# Patient Record
Sex: Male | Born: 1990 | Race: White | Hispanic: Yes | Marital: Single
Health system: Southern US, Community
[De-identification: ages and names within clinical notes are randomized; demographics above are authoritative.]

---

## 2019-08-08 ENCOUNTER — Encounter (HOSPITAL_COMMUNITY): Payer: Self-pay | Admitting: Emergency Medicine

## 2019-08-08 ENCOUNTER — Emergency Department (HOSPITAL_COMMUNITY): Payer: Self-pay

## 2019-08-08 ENCOUNTER — Emergency Department (HOSPITAL_COMMUNITY)
Admission: EM | Admit: 2019-08-08 | Discharge: 2019-08-08 | Disposition: A | Discharge status: Home / Self Care | Payer: Self-pay | Attending: Emergency Medicine | Admitting: Emergency Medicine

## 2019-08-08 ENCOUNTER — Other Ambulatory Visit: Payer: Self-pay

## 2019-08-08 DIAGNOSIS — R079 Chest pain, unspecified: Secondary | ICD-10-CM | POA: Insufficient documentation

## 2019-08-08 DIAGNOSIS — R51 Headache: Secondary | ICD-10-CM | POA: Insufficient documentation

## 2019-08-08 DIAGNOSIS — G4482 Headache associated with sexual activity: Secondary | ICD-10-CM

## 2019-08-08 LAB — RAPID URINE DRUG SCREEN, HOSP PERFORMED
Amphetamines: NOT DETECTED
Barbiturates: NOT DETECTED
Benzodiazepines: NOT DETECTED
Cocaine: NOT DETECTED
Opiates: NOT DETECTED
Tetrahydrocannabinol: NOT DETECTED

## 2019-08-08 LAB — BASIC METABOLIC PANEL
Anion gap: 12 (ref 5–15)
BUN: 14 mg/dL (ref 6–20)
CO2: 25 mmol/L (ref 22–32)
Calcium: 10.1 mg/dL (ref 8.9–10.3)
Chloride: 105 mmol/L (ref 98–111)
Creatinine, Ser: 1.46 mg/dL — ABNORMAL HIGH (ref 0.61–1.24)
GFR calc Af Amer: >60 mL/min (ref >60)
GFR calc non Af Amer: >60 mL/min (ref >60)
Glucose, Bld: 70 mg/dL (ref 70–99)
Potassium: 4 mmol/L (ref 3.5–5.1)
Sodium: 142 mmol/L (ref 135–145)

## 2019-08-08 LAB — CBC
HCT: 47.2 % (ref 39.0–52.0)
Hemoglobin: 15.7 g/dL (ref 13.0–17.0)
MCH: 29.3 pg (ref 26.0–34.0)
MCHC: 33.3 g/dL (ref 30.0–36.0)
MCV: 88.2 fL (ref 80.0–100.0)
Platelets: 225 10*3/uL (ref 150–400)
RBC: 5.35 MIL/uL (ref 4.22–5.81)
RDW: 12.4 % (ref 11.5–15.5)
WBC: 8.9 10*3/uL (ref 4.0–10.5)
nRBC: 0 % (ref 0.0–0.2)

## 2019-08-08 LAB — CSF CELL COUNT WITH DIFFERENTIAL
RBC Count, CSF: 1260 /mm3 — ABNORMAL HIGH
RBC Count, CSF: 48 /mm3 — ABNORMAL HIGH
Tube #: 1
Tube #: 4
WBC, CSF: 0 /mm3 (ref 0–5)
WBC, CSF: 0 /mm3 (ref 0–5)

## 2019-08-08 LAB — TROPONIN I (HIGH SENSITIVITY)
Troponin I (High Sensitivity): 3 ng/L (ref <18)
Troponin I (High Sensitivity): <2 ng/L (ref <18)

## 2019-08-08 LAB — PROTEIN, CSF: Total  Protein, CSF: 37 mg/dL (ref 15–45)

## 2019-08-08 LAB — GLUCOSE, CSF: Glucose, CSF: 51 mg/dL (ref 40–70)

## 2019-08-08 MED ORDER — IOHEXOL 350 MG/ML SOLN
50.0000 mL | Freq: Once | INTRAVENOUS | Status: AC | PRN
  Administered 2019-08-08: 18:00:00 50 mL via INTRAVENOUS

## 2019-08-08 MED ORDER — PROPRANOLOL HCL 40 MG PO TABS: 40.0000 mg | ORAL_TABLET | Freq: Every day | ORAL | 0 refills | Status: AC

## 2019-08-08 MED ORDER — SODIUM CHLORIDE 0.9% FLUSH: 3.0000 mL | Freq: Once | INTRAVENOUS | Status: DC

## 2019-08-08 NOTE — ED Notes (Signed)
Patient verbalizes understanding of discharge instructions. Opportunity for questioning and answers were provided. Armband removed by staff, pt discharged from ED ambulatory.   

## 2019-08-08 NOTE — ED Provider Notes (Signed)
[facility] Provider Note   CSN: [id number] Arrival date & time: 08/08/19  1345   History   Chief Complaint Chief Complaint  Patient presents with   Headache    HPI Michael Walter is a 28 y.o. male who presents with a headache. No significant PMH. He states that 4 days ago he was having sex with his girlfriend and when he was about to ejaculate he had a sudden severe headache over the lateral neck and bilateral temples. He had never had a headache like this before. It lasted 20 minutes and then went away. He was having sex again 2 days ago and this time he pre-medicated with Tylenol and Advil before but the headache came back and this time it was worse. He has had a residual mild headache since then. It's over the bilateral temples and forehead that is now mild in nature. Rest makes it better. He denies LOC, trauma, neck stiffness. He has never had this before so he decided to come to the ED to be checked. He denies drug use. He is from [place] but his mother lives here and he dropped his children off with her and then he decided to come here.  Additionally he reports some dull chest pain that started 2 days ago after eating seafood. He has had similar symptoms before in the setting of eating [place] food and it's gone away. He denies associated fever, chills, SOB, cough, abdominal pain, N/V, leg swelling.     HPI  History reviewed. No pertinent past medical history.  There are no active problems to display for this patient.   History reviewed. No pertinent surgical history.      Home Medications    Prior to Admission medications   Not on File    Family History No family history on file.  Social History Social History   Tobacco Use   Smoking status: Not on file  Substance Use Topics   Alcohol use: Not on file   Drug use: Not on file     Allergies   Patient has no known allergies.   Review of Systems Review of  Systems  Constitutional: Negative for fever.  Eyes: Negative for visual disturbance.  Respiratory: Negative for cough and shortness of breath.   Cardiovascular: Positive for chest pain. Negative for palpitations and leg swelling.  Gastrointestinal: Negative for abdominal pain, nausea and vomiting.  Musculoskeletal: Positive for neck pain.  Neurological: Positive for headaches. Negative for dizziness, syncope, weakness and numbness.  All other systems reviewed and are negative.    Physical Exam Updated Vital Signs BP 137/81 (BP Location: Right Arm)    Pulse 70    Temp 98.5 F (36.9 C) (Oral)    Resp 20    SpO2 100%   Physical Exam Vitals signs and nursing note reviewed.  Constitutional:      General: He is not in acute distress.    Appearance: Normal appearance. He is well-developed. He is not ill-appearing.  HENT:     Head: Normocephalic and atraumatic.  Eyes:     General: No scleral icterus.       Right eye: No discharge.        Left eye: No discharge.     Conjunctiva/sclera: Conjunctivae normal.     Pupils: Pupils are equal, round, and reactive to light.  Neck:     Musculoskeletal: Normal range of motion.  Cardiovascular:     Rate and Rhythm: Normal rate and regular rhythm.  Pulmonary:     Effort: Pulmonary effort is normal. No respiratory distress.     Breath sounds: Normal breath sounds.  Abdominal:     General: There is no distension.     Palpations: Abdomen is soft.     Tenderness: There is no abdominal tenderness.  Skin:    General: Skin is warm and dry.  Neurological:     Mental Status: He is alert and oriented to person, place, and time.     Comments: Lying on stretcher in NAD. GCS 15. Speaks in a clear voice. Cranial nerves II through XII grossly intact. 5/5 strength in all extremities. Sensation fully intact.  Bilateral finger-to-nose intact. Ambulatory    Psychiatric:        Behavior: Behavior normal.      ED Treatments / Results  Labs (all labs  ordered are listed, but only abnormal results are displayed) Labs Reviewed  BASIC METABOLIC PANEL - Abnormal; Notable for the following components:      Result Value   Creatinine, Ser 1.46 (*)    All other components within normal limits  CSF CELL COUNT WITH DIFFERENTIAL - Abnormal; Notable for the following components:   Color, CSF PINK (*)    Appearance, CSF HAZY (*)    RBC Count, CSF 1,260 (*)    All other components within normal limits  CSF CELL COUNT WITH DIFFERENTIAL - Abnormal; Notable for the following components:   RBC Count, CSF 48 (*)    All other components within normal limits  CSF CULTURE  CBC  RAPID URINE DRUG SCREEN, HOSP PERFORMED  GLUCOSE, CSF  PROTEIN, CSF  TROPONIN I (HIGH SENSITIVITY)  TROPONIN I (HIGH SENSITIVITY)    EKG None  Radiology Ct Angio Head W Or Wo Contrast  Result Date: 08/08/2019 CLINICAL DATA:  Initial evaluation for acute severe headache with neck pain. EXAM: CT ANGIOGRAPHY HEAD AND NECK TECHNIQUE: Multidetector CT imaging of the head and neck was performed using the standard protocol during bolus administration of intravenous contrast. Multiplanar CT image reconstructions and MIPs were obtained to evaluate the vascular anatomy. Carotid stenosis measurements (when applicable) are obtained utilizing NASCET criteria, using the distal internal carotid diameter as the denominator. CONTRAST:  50mL OMNIPAQUE IOHEXOL 350 MG/ML SOLN COMPARISON:  None. FINDINGS: CT HEAD FINDINGS Brain: Cerebral volume within normal limits for patient age. No evidence for acute intracranial hemorrhage. No findings to suggest acute large vessel territory infarct. No mass lesion, midline shift, or mass effect. Ventricles are normal in size without evidence for hydrocephalus. No extra-axial fluid collection identified. Vascular: No hyperdense vessel identified. Skull: Scalp soft tissues demonstrate no acute abnormality. Calvarium intact. Sinuses/Orbits: Globes and orbital soft  tissues within normal limits. Visualized paranasal sinuses are clear. No mastoid effusion. CTA NECK FINDINGS Aortic arch: Visualized aortic arch of normal caliber with normal branch pattern. No hemodynamically significant stenosis or other abnormality about the origin of the great vessels. Right carotid system: Right common and internal carotid arteries widely patent without stenosis, dissection, or occlusion. Left carotid system: Left common and internal carotid arteries widely patent without stenosis, dissection, or occlusion. Vertebral arteries: Both vertebral arteries arise from the subclavian arteries. Vertebral arteries widely patent within the neck without stenosis, dissection, or occlusion. Skeleton: No acute osseous abnormality. No discrete lytic or blastic osseous lesions. Other neck: No other acute soft tissue abnormality within the neck. Upper chest: Visualized upper chest demonstrates no acute finding. Review of the MIP images confirms the above findings CTA HEAD FINDINGS Anterior  circulation: Internal carotid arteries widely patent to the termini without stenosis or other abnormality. 2-3 mm funnel shaped outpouching arising from the cavernous left ICA at the expected takeoff of the left ophthalmic artery most consistent with a small vascular infundibulum. ICA termini well perfused. A1 segments patent bilaterally. Normal anterior communicating artery complex. Anterior cerebral arteries widely patent to their distal aspects. No M1 stenosis or occlusion. Normal MCA bifurcations. Distal MCA branches well perfused and symmetric. Posterior circulation: Vertebral arteries widely patent to the vertebrobasilar junction without stenosis. Posteroinferior cerebral arteries patent bilaterally. 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 Finding is indeterminate. No raised dissection flap, significant intimal irregularity, or intraluminal  thrombus. Basilar tip is widely patent distally. Superior cerebral arteries patent bilaterally. Both of the posterior cerebral arteries primarily supplied via the basilar and are well perfused to their distal aspects. Venous sinuses: Grossly patent allowing for timing of the contrast bolus. Anatomic variants: None significant. Review of the MIP images confirms the above findings IMPRESSION: CT HEAD IMPRESSION: Negative head CT.  No acute intracranial abnormality identified. CTA HEAD AND NECK IMPRESSION: 1. 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 Finding is indeterminate, and could reflect sequelae of accelerated atherosclerotic disease or possibly sequelae of remotely healed dissection. No raised dissection flap, significant intimal irregularity, or intraluminal thrombus to suggest acute dissection on today's exam. 2. Otherwise unremarkable CTA of the head and neck. No large vessel occlusion. No other hemodynamically significant or correctable stenosis. Electronically Signed   By: Rise MuBenjamin  McClintock M.D.   On: 08/08/2019 18:47   Ct Angio Neck W And/or Wo Contrast  Result Date: 08/08/2019 CLINICAL DATA:  Initial evaluation for acute severe headache with neck pain. EXAM: CT ANGIOGRAPHY HEAD AND NECK TECHNIQUE: Multidetector CT imaging of the head and neck was performed using the standard protocol during bolus administration of intravenous contrast. Multiplanar CT image reconstructions and MIPs were obtained to evaluate the vascular anatomy. Carotid stenosis measurements (when applicable) are obtained utilizing NASCET criteria, using the distal internal carotid diameter as the denominator. CONTRAST:  50mL OMNIPAQUE IOHEXOL 350 MG/ML SOLN COMPARISON:  None. FINDINGS: CT HEAD FINDINGS Brain: Cerebral volume within normal limits for patient age. No evidence for acute intracranial hemorrhage. No findings to suggest acute large vessel territory infarct. No mass  lesion, midline shift, or mass effect. Ventricles are normal in size without evidence for hydrocephalus. No extra-axial fluid collection identified. Vascular: No hyperdense vessel identified. Skull: Scalp soft tissues demonstrate no acute abnormality. Calvarium intact. Sinuses/Orbits: Globes and orbital soft tissues within normal limits. Visualized paranasal sinuses are clear. No mastoid effusion. CTA NECK FINDINGS Aortic arch: Visualized aortic arch of normal caliber with normal branch pattern. No hemodynamically significant stenosis or other abnormality about the origin of the great vessels. Right carotid system: Right common and internal carotid arteries widely patent without stenosis, dissection, or occlusion. Left carotid system: Left common and internal carotid arteries widely patent without stenosis, dissection, or occlusion. Vertebral arteries: Both vertebral arteries arise from the subclavian arteries. Vertebral arteries widely patent within the neck without stenosis, dissection, or occlusion. Skeleton: No acute osseous abnormality. No discrete lytic or blastic osseous lesions. Other neck: No other acute soft tissue abnormality within the neck. Upper chest: Visualized upper chest demonstrates no acute finding. Review of the MIP images confirms the above findings CTA HEAD FINDINGS Anterior circulation: Internal carotid arteries widely patent to the termini without stenosis or other abnormality. 2-3 mm  funnel shaped outpouching arising from the cavernous left ICA at the expected takeoff of the left ophthalmic artery most consistent with a small vascular infundibulum. ICA termini well perfused. A1 segments patent bilaterally. Normal anterior communicating artery complex. Anterior cerebral arteries widely patent to their distal aspects. No M1 stenosis or occlusion. Normal MCA bifurcations. Distal MCA branches well perfused and symmetric. Posterior circulation: Vertebral arteries widely patent to the  vertebrobasilar junction without stenosis. Posteroinferior cerebral arteries patent bilaterally. 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 Finding is indeterminate. No raised dissection flap, significant intimal irregularity, or intraluminal thrombus. Basilar tip is widely patent distally. Superior cerebral arteries patent bilaterally. Both of the posterior cerebral arteries primarily supplied via the basilar and are well perfused to their distal aspects. Venous sinuses: Grossly patent allowing for timing of the contrast bolus. Anatomic variants: None significant. Review of the MIP images confirms the above findings IMPRESSION: CT HEAD IMPRESSION: Negative head CT.  No acute intracranial abnormality identified. CTA HEAD AND NECK IMPRESSION: 1. 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 Finding is indeterminate, and could reflect sequelae of accelerated atherosclerotic disease or possibly sequelae of remotely healed dissection. No raised dissection flap, significant intimal irregularity, or intraluminal thrombus to suggest acute dissection on today's exam. 2. Otherwise unremarkable CTA of the head and neck. No large vessel occlusion. No other hemodynamically significant or correctable stenosis. Electronically Signed   By: Jeannine Boga M.D.   On: 08/08/2019 18:47   Dg Chest Port 1 View  Result Date: 08/08/2019 CLINICAL DATA:  Chest pain.  Headache. EXAM: PORTABLE CHEST 1 VIEW COMPARISON:  None. FINDINGS: Midline trachea. Normal heart size and mediastinal contours. No pleural effusion or pneumothorax. Clear lungs. Minimal convex right thoracic spine curvature. IMPRESSION: No acute cardiopulmonary disease. Electronically Signed   By: Abigail Miyamoto M.D.   On: 08/08/2019 17:04    Procedures .Lumbar Puncture  Date/Time: 08/08/2019 11:40 PM Performed by: Recardo Evangelist,  PA-C Authorized by: Recardo Evangelist, PA-C   Consent:    Consent obtained:  Verbal   Consent given by:  Patient   Risks discussed:  Bleeding, infection, pain, nerve damage, repeat procedure and headache   Alternatives discussed:  No treatment Pre-procedure details:    Procedure purpose:  Diagnostic   Preparation: Patient was prepped and draped in usual sterile fashion   Anesthesia (see MAR for exact dosages):    Anesthesia method:  Local infiltration   Local anesthetic:  Lidocaine 1% w/o epi Procedure details:    Lumbar space:  L4-L5 interspace   Patient position:  R lateral decubitus   Needle gauge:  18   Needle type:  Spinal needle - Quincke tip   Needle length (in):  5.0   Ultrasound guidance: no     Number of attempts:  1   Fluid appearance:  Blood-tinged then clearing   Tubes of fluid:  4   Total volume (ml):  4 Post-procedure:    Puncture site:  Adhesive bandage applied and direct pressure applied   Patient tolerance of procedure:  Tolerated well, no immediate complications   (including critical care time)    Medications Ordered in ED Medications  sodium chloride flush (NS) 0.9 % injection 3 mL (has no administration in time range)  iohexol (OMNIPAQUE) 350 MG/ML injection 50 mL (50 mLs Intravenous Contrast Given 08/08/19 1759)     Initial Impression / Assessment and Plan / ED Course  I  have reviewed the triage vital signs and the nursing notes.  Pertinent labs & imaging results that were available during my care of the patient were reviewed by me and considered in my medical decision making (see chart for details).  28 year old male present with a headache that is associated with sexual activity. His vitals are normal, he is in NAD. He has some concerning features such as sudden onset of a severe headache and different from previous headache however he is young, has a normal neurologic exam, and symptoms have been over 4 days now. I highly doubt that he has a  subarachonid hemorrhage but will obtain CT head, and CTA of the head/neck. Symptoms sound consistent with a sexual headache. He also has chest pain and this is in the setting of eating seafood. It sounds consistent with heartburn or gastritis since he has been taking OTC meds for his headache. EKG is SR. CXR is negative. 1st and 2nd Trop are normal. CBC is normal and BMP is remarkable for SCr of 1.4. Unclear of what his baseline is. He states he is from [place] and just visiting.  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 Will discuss with Neurology.   8:00 PM Discussed with Dr. Laurence Slate. He recommends LP for complete evaluation  LP completed. CSF does not appear significantly bloody. He was given reassurance regarding his symptoms. Will start him on Propranolol 40mg  daily for his symptoms. Strongly encouraged neurology f/u.    Final Clinical Impressions(s) / ED Diagnoses   Final diagnoses:  Headache associated with sexual activity    ED Discharge Orders    None       Beryle Quant 08/08/19 [place], MD 08/09/19 1242

## 2019-08-08 NOTE — Discharge Instructions (Signed)
Take Propranolol once daily for headache Please follow up with a neurologist Return if worsening

## 2019-08-08 NOTE — ED Triage Notes (Addendum)
Pt here for eval of headache and chest pain to mid chest for 3 days. Pt also has neck pain. Denies N/V/D or fevers. No shortness of breath or cough.

## 2019-08-12 LAB — CSF CULTURE W GRAM STAIN: Culture: NO GROWTH

## 2020-08-03 IMAGING — CT CT ANGIOGRAPHY HEAD
2 of 11 series · 7 of 33 positions shown · IV contrast (APPLIED)
Comparison: None.

CLINICAL DATA: Initial evaluation for acute severe headache with
neck pain.

EXAM:
CT ANGIOGRAPHY HEAD AND NECK
TECHNIQUE: Multidetector CT imaging of the head and neck was performed using
the standard protocol during bolus administration of intravenous
contrast. Multiplanar CT image reconstructions and MIPs were
obtained to evaluate the vascular anatomy. Carotid stenosis
measurements (when applicable) are obtained utilizing NASCET
criteria, using the distal internal carotid diameter as the
denominator.
CONTRAST:  50mL OMNIPAQUE IOHEXOL 350 MG/ML SOLN

[Series 5: cta neck/head · axial · 0.47mm/px · z∈[+974,+1098]mm · 2 of 188 slices shown]
[im 63/188  soft-tissue]
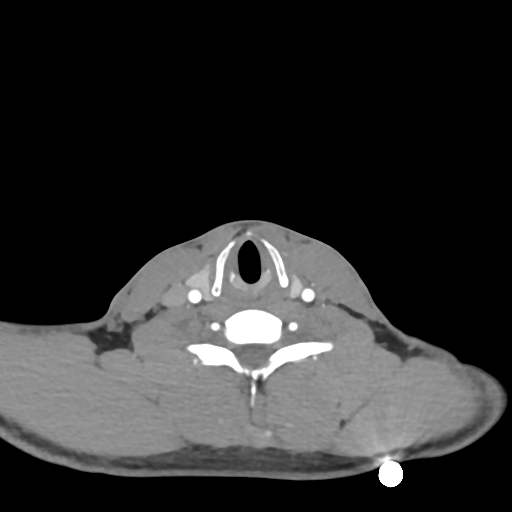
[im 125/188  soft-tissue]
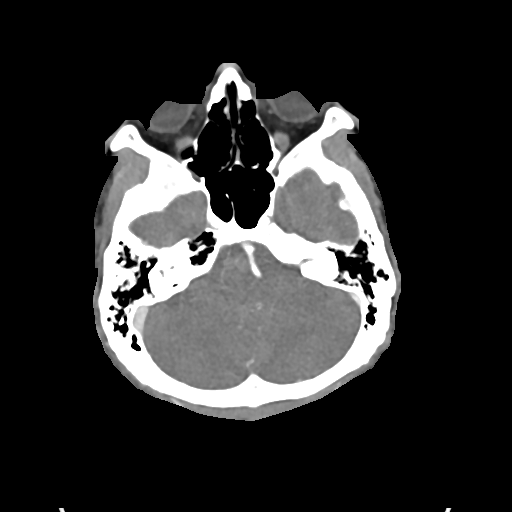

[Series 11: ax thins · axial · 0.44mm/px · z∈[+900,+1147]mm · 5 of 374 slices shown]
[im 63/374  soft-tissue]
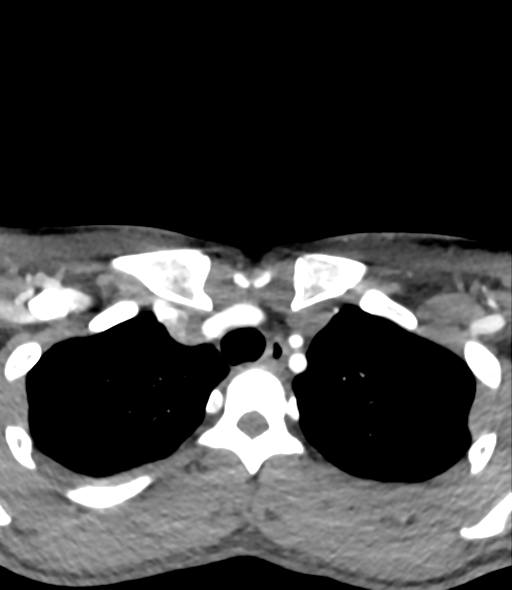
[im 125/374  bone]
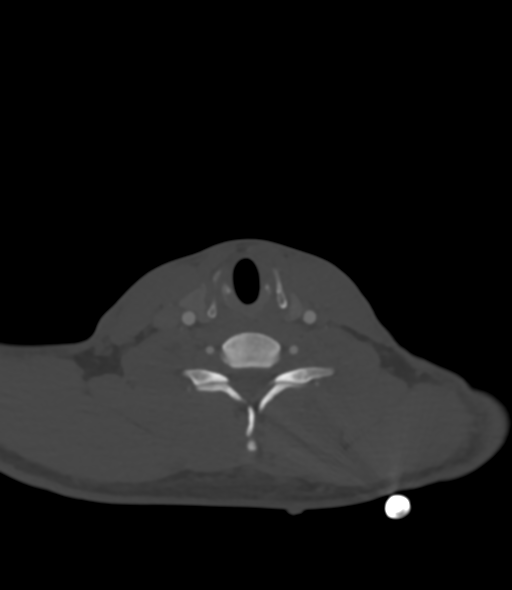
[im 187/374  soft-tissue]
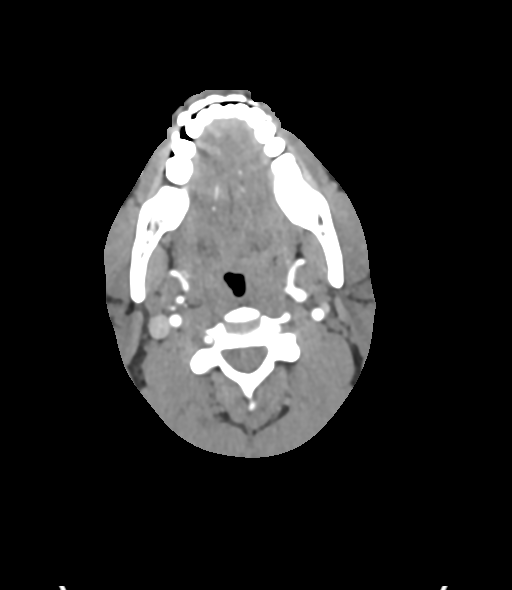
[im 249/374  bone]
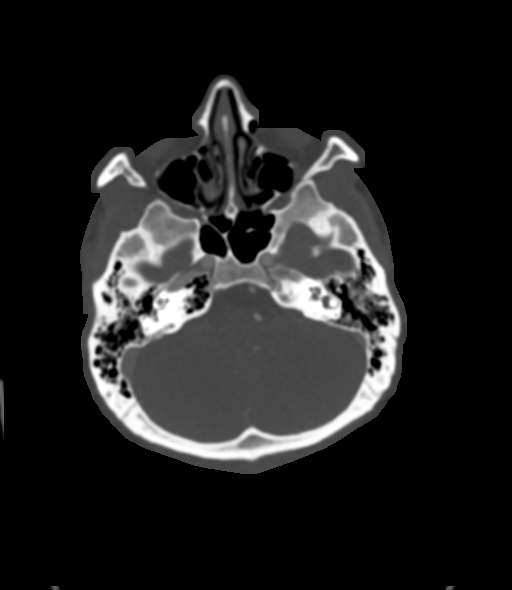
[im 311/374  soft-tissue]
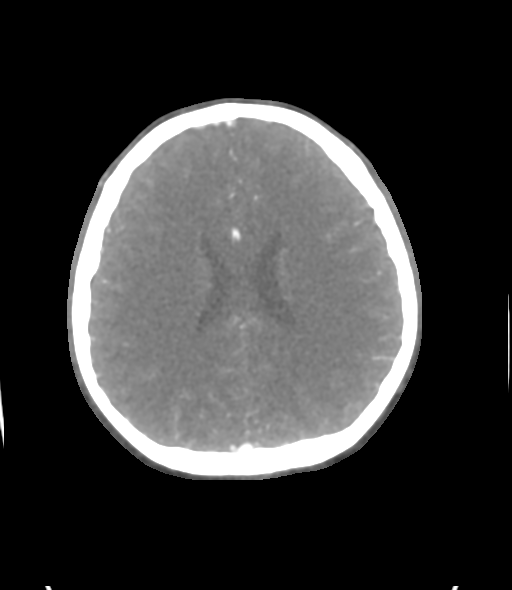

[7 of 33 positions shown; findings below may reference images not displayed]

FINDINGS: CT HEAD FINDINGS

Brain: Cerebral volume within normal limits for patient age.

No evidence for acute intracranial hemorrhage. No findings to
suggest acute large vessel territory infarct. No mass lesion,
midline shift, or mass effect. Ventricles are normal in size without
evidence for hydrocephalus. No extra-axial fluid collection
identified.

Vascular: No hyperdense vessel identified.

Skull: Scalp soft tissues demonstrate no acute abnormality.
Calvarium intact.

Sinuses/Orbits: Globes and orbital soft tissues within normal
limits.

Visualized paranasal sinuses are clear. No mastoid effusion.

CTA NECK FINDINGS

Aortic arch: Visualized aortic arch of normal caliber with normal
branch pattern. No hemodynamically significant stenosis or other
abnormality about the origin of the great vessels.

Right carotid system: Right common and internal carotid arteries
widely patent without stenosis, dissection, or occlusion.

Left carotid system: Left common and internal carotid arteries
widely patent without stenosis, dissection, or occlusion.

Vertebral arteries: Both vertebral arteries arise from the
subclavian arteries. Vertebral arteries widely patent within the
neck without stenosis, dissection, or occlusion.

Skeleton: No acute osseous abnormality. No discrete lytic or blastic
osseous lesions.

Other neck: No other acute soft tissue abnormality within the neck.

Upper chest: Visualized upper chest demonstrates no acute finding.

Review of the MIP images confirms the above findings

CTA HEAD FINDINGS

Anterior circulation: Internal carotid arteries widely patent to the
termini without stenosis or other abnormality. 2-3 mm funnel shaped
outpouching arising from the cavernous left ICA at the expected
takeoff of the left ophthalmic artery most consistent with a small
vascular infundibulum. ICA termini well perfused. A1 segments patent
bilaterally. Normal anterior communicating artery complex. Anterior
cerebral arteries widely patent to their distal aspects. No M1
stenosis or occlusion. Normal MCA bifurcations. Distal MCA branches
well perfused and symmetric.

Posterior circulation: Vertebral arteries widely patent to the
vertebrobasilar junction without stenosis. Posteroinferior cerebral
arteries patent bilaterally. Multifocal irregularity with severe
stenosis seen involving the proximal-mid basilar artery (series 15,
image 22). Finding is indeterminate. No raised dissection flap,
significant intimal irregularity, or intraluminal thrombus. Basilar
tip is widely patent distally. Superior cerebral arteries patent
bilaterally. Both of the posterior cerebral arteries primarily
supplied via the basilar and are well perfused to their distal
aspects.

Venous sinuses: Grossly patent allowing for timing of the contrast
bolus.

Anatomic variants: None significant.

Review of the MIP images confirms the above findings
IMPRESSION: CT HEAD IMPRESSION:

Negative head CT.  No acute intracranial abnormality identified.

CTA HEAD AND NECK IMPRESSION:

1. Multifocal irregularity with up to severe stenosis involving the
proximal-mid basilar artery. Finding is indeterminate, and could
reflect sequelae of accelerated atherosclerotic disease or possibly
sequelae of remotely healed dissection. No raised dissection flap,
significant intimal irregularity, or intraluminal thrombus to
suggest acute dissection on today's exam.
2. Otherwise unremarkable CTA of the head and neck. No large vessel
occlusion. No other hemodynamically significant or correctable
stenosis.

## 2020-08-03 IMAGING — DX PORTABLE CHEST - 1 VIEW
1 series · 1 of 1 positions shown · non-contrast
Comparison: None.

CLINICAL DATA: Chest pain.  Headache.

EXAM:
PORTABLE CHEST 1 VIEW

[chest ap]
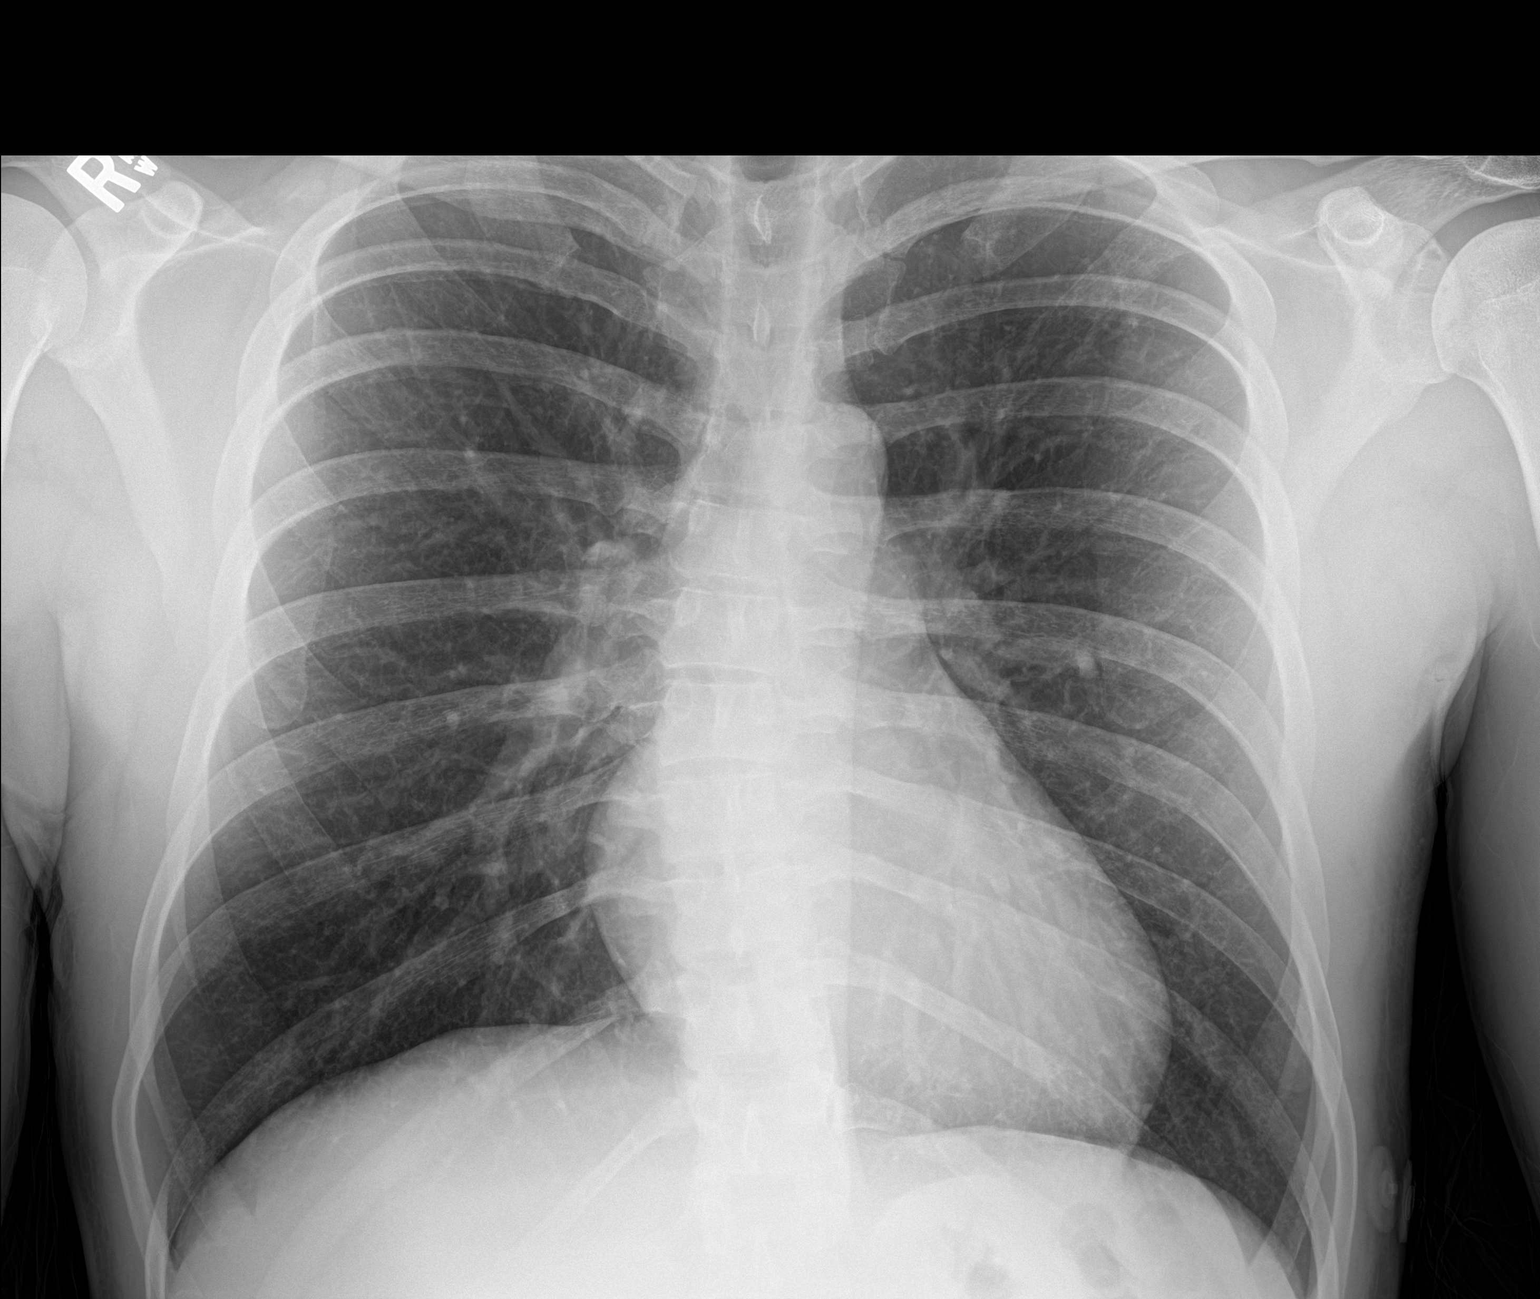

[1 of 1 positions shown; findings below may reference images not displayed]

FINDINGS: Midline trachea. Normal heart size and mediastinal contours. No
pleural effusion or pneumothorax. Clear lungs. Minimal convex right
thoracic spine curvature.
IMPRESSION: No acute cardiopulmonary disease.
